# Patient Record
Sex: Male | Born: 2000 | Race: Black or African American | Hispanic: No | Marital: Single | Smoking: Never smoker
Health system: Southern US, Community
[De-identification: ages and names within clinical notes are randomized; demographics above are authoritative.]

## PROBLEM LIST (undated history)

## (undated) DIAGNOSIS — R51 Headache: Secondary | ICD-10-CM

## (undated) HISTORY — DX: Headache: R51

## (undated) HISTORY — PX: CIRCUMCISION: SUR203

---
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
## 2019-03-15 ENCOUNTER — Other Ambulatory Visit: Payer: Self-pay

## 2019-03-15 DIAGNOSIS — Z20822 Contact with and (suspected) exposure to covid-19: Secondary | ICD-10-CM

## 2019-03-16 LAB — NOVEL CORONAVIRUS, NAA: SARS-CoV-2, NAA: NOT DETECTED
